# Patient Record
Sex: Male | Born: 1975 | Race: Black or African American | Hispanic: No
Health system: Southern US, Community
[De-identification: ages and names within clinical notes are randomized; demographics above are authoritative.]

---

## 2006-11-18 ENCOUNTER — Emergency Department (HOSPITAL_COMMUNITY): Admission: EM | Admit: 2006-11-18 | Discharge: 2006-11-18 | Payer: Self-pay | Admitting: Emergency Medicine

## 2006-11-25 ENCOUNTER — Emergency Department (HOSPITAL_COMMUNITY): Admission: EM | Admit: 2006-11-25 | Discharge: 2006-11-25 | Payer: Self-pay | Admitting: Emergency Medicine

## 2006-12-15 ENCOUNTER — Emergency Department (HOSPITAL_COMMUNITY): Admission: EM | Admit: 2006-12-15 | Discharge: 2006-12-15 | Payer: Self-pay | Admitting: Emergency Medicine
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
## 2015-10-06 ENCOUNTER — Emergency Department (HOSPITAL_COMMUNITY)
Admission: EM | Admit: 2015-10-06 | Discharge: 2015-10-07 | Disposition: A | Discharge status: Home / Self Care | Payer: Self-pay | Attending: Emergency Medicine | Admitting: Emergency Medicine

## 2015-10-06 ENCOUNTER — Encounter (HOSPITAL_COMMUNITY): Payer: Self-pay | Admitting: Emergency Medicine

## 2015-10-06 DIAGNOSIS — Y9389 Activity, other specified: Secondary | ICD-10-CM | POA: Insufficient documentation

## 2015-10-06 DIAGNOSIS — X58XXXA Exposure to other specified factors, initial encounter: Secondary | ICD-10-CM | POA: Insufficient documentation

## 2015-10-06 DIAGNOSIS — Y9289 Other specified places as the place of occurrence of the external cause: Secondary | ICD-10-CM | POA: Insufficient documentation

## 2015-10-06 DIAGNOSIS — Y998 Other external cause status: Secondary | ICD-10-CM | POA: Insufficient documentation

## 2015-10-06 DIAGNOSIS — Z79899 Other long term (current) drug therapy: Secondary | ICD-10-CM | POA: Insufficient documentation

## 2015-10-06 DIAGNOSIS — T7840XA Allergy, unspecified, initial encounter: Secondary | ICD-10-CM | POA: Insufficient documentation
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
## 2015-10-06 NOTE — ED Provider Notes (Signed)
CSN: [id number]     Arrival date & time 10/06/15  2236 History   First MD Initiated Contact with Patient 10/06/15 2246     Chief Complaint  Patient presents with  . Allergic Reaction    lock jaw      (Consider location/radiation/quality/duration/timing/severity/associated sxs/prior Treatment) HPI Luis Hurley is a 40 y.o. male who presents via EMS from jail. Patient reports he takes Seroquel, but was given Zoloft in jail and allergic reaction. Patient reports he had this medication, Zoloft, at approximately 11:00 this morning at around approximately 5 or 6:00 PM he began to develop a scratchy throat. He reports he began to feel his muscles "locking up, my face turned to the side, and my tongue stuck out". He reports the nurse told him that she was administering epinephrine into his shoulder with a syringe. Per nursing note, jail nurse states she visualized lip edema. He reports he is back to baseline now. Denies any difficulties breathing, swallowing, nausea or vomiting, abdominal pain or cramping, rash. No other known allergies.   History reviewed. No pertinent past medical history. History reviewed. No pertinent past surgical history. No family history on file. Social History  Substance Use Topics  . Smoking status: Unknown If Ever Smoked  . Smokeless tobacco: None  . Alcohol Use: None    Review of Systems A 10 point review of systems was completed and was negative except for pertinent positives and negatives as mentioned in the history of present illness     Allergies  Haldol  Home Medications   Prior to Admission medications   Medication Sig Start Date End Date Taking? Authorizing Provider  LORazepam (ATIVAN) 0.5 MG tablet Take 0.5 mg by mouth every 8 (eight) hours as needed for anxiety.   Yes Historical Provider, MD  QUEtiapine (SEROQUEL) 200 MG tablet Take 200 mg by mouth 2 (two) times daily.   Yes Historical Provider, MD   BP 133/94 mmHg  Pulse 58  Temp(Src) 97.7  F (36.5 C) (Oral)  Resp 16  Ht 5\' 7"  (1.702 m)  Wt 70.308 kg  BMI 24.27 kg/m2  SpO2 100% Physical Exam  Constitutional: He is oriented to person, place, and time. He appears well-developed and well-nourished.  HENT:  Head: Normocephalic and atraumatic.  Mouth/Throat: Oropharynx is clear and moist.  Eyes: Conjunctivae are normal. Pupils are equal, round, and reactive to light. Right eye exhibits no discharge. Left eye exhibits no discharge. No scleral icterus.  Neck: Neck supple.  Cardiovascular: Normal rate, regular rhythm and normal heart sounds.   Pulmonary/Chest: Effort normal and breath sounds normal. No respiratory distress. He has no wheezes. He has no rales.  Abdominal: Soft. There is no tenderness.  Musculoskeletal: He exhibits no tenderness.  Neurological: He is alert and oriented to person, place, and time.  Cranial Nerves II-XII grossly intact  Skin: Skin is warm and dry. No rash noted.  Psychiatric: He has a normal mood and affect.  Nursing note and vitals reviewed.   ED Course  Procedures (including critical care time) Labs Review Labs Reviewed - No data to display  Imaging Review No results found. I have personally reviewed and evaluated these images and lab results as part of my medical decision-making.   EKG Interpretation None     Filed Vitals:   10/06/15 2236 10/07/15 0059  BP: 133/84 133/94  Pulse: 64 58  Temp: 98.8 F (37.1 C) 97.7 F (36.5 C)  TempSrc: Oral Oral  Resp: 12 16  Height: 5\' 7"  (  1.702 m)   Weight: 70.308 kg   SpO2: 97% 100%    MDM  Luis Hurley is a 40 y.o. male who presents from jail for evaluation of allergic reaction. Patient has no signs or symptoms concerning for anaphylaxis and story is not concerning for emergent allergic reaction. Physical exam is completely unremarkable. He is at baseline now, hemodynamically stable with normal vital signs and is afebrile. Stable for discharge. Prior to patient discharge, I  discussed and reviewed this case with Dr.Liu   Final diagnoses:  Allergic reaction, initial encounter        Joycie Peek, PA-C 10/07/15 0110  Lavera Guise, MD 10/07/15 [phone]

## 2015-10-06 NOTE — ED Notes (Signed)
Pt comes to Ed, via ems/ jail - pt was given Zoloft and had reaction. cbg 104.  Pt verbalized he was feeling, and jaw was locking up. The jail nurse stated she visualized lip edema  After taking Zoloft 50 mg , Zyprexa 20 mg tabs. New meds given to pt per not having his usual meds. Jail gave the epi pen, and ems provided benadryl. Ems states he verbalizes he is all better now.      Vs on arrival bp 129/85, pulse 67, spo2 97 room air.

## 2015-10-07 ENCOUNTER — Encounter (HOSPITAL_COMMUNITY): Payer: Self-pay | Admitting: Emergency Medicine

## 2015-10-07 ENCOUNTER — Emergency Department (HOSPITAL_COMMUNITY)
Admission: EM | Admit: 2015-10-07 | Discharge: 2015-10-07 | Disposition: A | Discharge status: Home / Self Care | Payer: Self-pay | Attending: Emergency Medicine | Admitting: Emergency Medicine

## 2015-10-07 DIAGNOSIS — T43595A Adverse effect of other antipsychotics and neuroleptics, initial encounter: Secondary | ICD-10-CM | POA: Insufficient documentation

## 2015-10-07 DIAGNOSIS — Z79899 Other long term (current) drug therapy: Secondary | ICD-10-CM | POA: Insufficient documentation

## 2015-10-07 DIAGNOSIS — G2402 Drug induced acute dystonia: Secondary | ICD-10-CM | POA: Insufficient documentation

## 2015-10-07 DIAGNOSIS — T43225A Adverse effect of selective serotonin reuptake inhibitors, initial encounter: Secondary | ICD-10-CM | POA: Insufficient documentation

## 2015-10-07 MED ORDER — DIPHENHYDRAMINE HCL 25 MG PO CAPS
50.0000 mg | ORAL_CAPSULE | Freq: Once | ORAL | Status: AC
  Administered 2015-10-07: 50 mg via ORAL
  Filled 2015-10-07: qty 2

## 2015-10-07 MED ORDER — DIPHENHYDRAMINE HCL 25 MG PO TABS: 50.0000 mg | ORAL_TABLET | Freq: Four times a day (QID) | ORAL | Status: AC | PRN

## 2015-10-07 NOTE — ED Notes (Signed)
re arrived pt after dc for another occurrence of lock jaw.

## 2015-10-07 NOTE — Discharge Instructions (Signed)
You did not have an allergic reaction. You had a dystonic reaction to the Zyprexa. Please do not get administered epinephrine if this occurs again. Treatment for this is Benadryl or benztropine.Take benadryl 50 mg every 6 hours as needed. Your physician at your facility shoulder consider using a different medication other than Zyprexa.      Dystonic Reaction Dystonia is a condition that makes your muscles contract without warning (muscle spasms). It can cause unwanted, uncomfortable jerking of muscle groups. This condition is rarely life threatening. CAUSES A dystonic reaction is most often a side effect of a particular medicine.These reactions occur when the normal patterns of the nerve receptors are upset by a particular medicine, and the imbalance causes multiple types of muscle spasm. This condition may also be caused by nervous system disorders, such as:  Stroke.  Multiple sclerosis (MS).  Cerebral palsy. In some cases, the cause is not known (idiopathic). RISK FACTORS This condition is more likely to develop in people who take certain medicines, most often medicines that are used to treat psychiatric conditions or nausea. SYMPTOMS Symptoms of dystonia can vary. Symptoms may include:  Muscle twitches or spasms around your eyes (blepharospasm).  Foot cramping or dragging.  Pulling of your neck to one side (torticollis) or backward (retrocollis).  Muscles spasms of your face.  Spasms of your voice box (larynx).  Tremors.  Awkward and painful positions.  Muscle cramping after muscle use.  Spasm of your jaw muscles that makes it difficult to open your mouth. DIAGNOSIS This condition is often easy to diagnose based on the patterns of muscle contractions in your body and how your body responds to treatment. Diagnosis will also include a physical exam and medical history. You may have other tests if the cause of your condition is not known. TREATMENT The treatment of this  condition depends on the underlying cause. If this condition is caused by medicine that you take, your health care provider will likely recommend stopping the use of that medicine. Most often, this condition can be treated with medicines that help to relax the muscles and reverse the reaction (anticholinergics). Botulinum toxin may also be injected to stop the involved muscles from contracting. In severe cases when these treatments do not work, surgery and brain stimulation may be required. HOME CARE INSTRUCTIONS  Talk with your health care provider about avoiding the use of the medicine or medicines that are thought to be the cause of the reaction.  Do not drive or operate heavy machinery until your health care provider approves.  Take medicines only as directed by your health care provider. SEEK MEDICAL CARE IF:  Your original symptoms return after treatment.   This information is not intended to replace advice given to you by your health care provider. Make sure you discuss any questions you have with your health care provider.   Document Released: 07/01/2000 Document Revised: 11/18/2014 Document Reviewed: 06/30/2014 Elsevier Interactive Patient Education [employer]2016 [employer]

## 2015-10-07 NOTE — Discharge Instructions (Signed)
There does not appear to be in emergent cause for your symptoms at this time. I do not think you experienced any anaphylaxis or emergent allergic reaction. Follow-up with your doctor as needed. Return to ED for any new or worsening symptoms  Allergies An allergy is an abnormal reaction to a substance by the body's defense system (immune system). Allergies can develop at any age. WHAT CAUSES ALLERGIES? An allergic reaction happens when the immune system mistakenly reacts to a normally harmless substance, called an allergen, as if it were harmful. The immune system releases antibodies to fight the substance. Antibodies eventually release a chemical called histamine into the bloodstream. The release of histamine is meant to protect the body from infection, but it also causes discomfort. An allergic reaction can be triggered by:  Eating an allergen.  Inhaling an allergen.  Touching an allergen. WHAT TYPES OF ALLERGIES ARE THERE? There are many types of allergies. Common types include:  Seasonal allergies. People with this type of allergy are usually allergic to substances that are only present during certain seasons, such as molds and pollens.  Food allergies.  Drug allergies.  Insect allergies.  Animal dander allergies. WHAT ARE SYMPTOMS OF ALLERGIES? Possible allergy symptoms include:  Swelling of the lips, face, tongue, mouth, or throat.  Sneezing, coughing, or wheezing.  Nasal congestion.  Tingling in the mouth.  Rash.  Itching.  Itchy, red, swollen areas of skin (hives).  Watery eyes.  Vomiting.  Diarrhea.  Dizziness.  Lightheadedness.  Fainting.  Trouble breathing or swallowing.  Chest tightness.  Rapid heartbeat. HOW ARE ALLERGIES DIAGNOSED? Allergies are diagnosed with a medical and family history and one or more of the following:  Skin tests.  Blood tests.  A food diary. A food diary is a record of all the foods and drinks you have in a day and  of all the symptoms you experience.  The results of an elimination diet. An elimination diet involves eliminating foods from your diet and then adding them back in one by one to find out if a certain food causes an allergic reaction. HOW ARE ALLERGIES TREATED? There is no cure for allergies, but allergic reactions can be treated with medicine. Severe reactions usually need to be treated at a hospital. HOW CAN REACTIONS BE PREVENTED? The best way to prevent an allergic reaction is by avoiding the substance you are allergic to. Allergy shots and medicines can also help prevent reactions in some cases. People with severe allergic reactions may be able to prevent a life-threatening reaction called anaphylaxis with a medicine given right after exposure to the allergen.   This information is not intended to replace advice given to you by your health care provider. Make sure you discuss any questions you have with your health care provider.   Document Released: 09/27/2002 Document Revised: 07/25/2014 Document Reviewed: 04/15/2014 Elsevier Interactive Patient Education [employer]

## 2015-10-07 NOTE — ED Provider Notes (Signed)
CSN: [id number]     Arrival date & time 10/07/15  [id number] History   First MD Initiated Contact with Patient 10/07/15 0203     Chief Complaint  Patient presents with  . Allergic Reaction    lock jaw      (Consider location/radiation/quality/duration/timing/severity/associated sxs/prior Treatment) HPI 40 year old male who presents with concern of allergic reaction. He is presenting from [facility], and states that he was just started on Zoloft and Zyprexa today. Shortly after taking this medication I he felt like his body stiffened, he had spasming of his tongue to the left side, and felt spasms in his neck forcing his neck to turn left. He did not have any oral pharyngeal swelling, difficulty breathing, chest pain, nausea or vomiting, or abdominal pain. Was given an EpiPen by the nurse at his facility as well as Benadryl. On arrival he states that all of his symptoms fully resolved. Was sent back to correctional facility as his symptoms and was not consistent with allergic reaction and likely dystonic reaction. He was brought back to the ED, due to recurrence of his dystonic reaction in route back to the facility. Denies any difficulty breathing, chest pain, swelling of his throat or mouth. States that he otherwise has been in his usual state of health.  History reviewed. No pertinent past medical history. History reviewed. No pertinent past surgical history. History reviewed. No pertinent family history. Social History  Substance Use Topics  . Smoking status: Unknown If Ever Smoked  . Smokeless tobacco: None  . Alcohol Use: None    Review of Systems 10/14 systems reviewed and are negative other than those stated in the HPI    Allergies  Haldol and Sertraline  Home Medications   Prior to Admission medications   Medication Sig Start Date End Date Taking? Authorizing Provider  LORazepam (ATIVAN) 0.5 MG tablet Take 0.5 mg by mouth every 8 (eight) hours as needed for anxiety.    Yes Historical Provider, MD  QUEtiapine (SEROQUEL) 200 MG tablet Take 200 mg by mouth 2 (two) times daily.   Yes Historical Provider, MD  diphenhydrAMINE (BENADRYL) 25 MG tablet Take 2 tablets (50 mg total) by mouth every 6 (six) hours as needed (dystonic reaction). 10/07/15   Lavera Guiseana Duo Bassy Fetterly, MD   BP 130/87 mmHg  Pulse 68  Temp(Src) 98.8 F (37.1 C) (Oral)  Resp 18  SpO2 100% Physical Exam Nursing note and vitals reviewed.  Constitutional: Well developed, well nourished, non-toxic, and in no acute distress  Head: Normocephalic and atraumatic.  Mouth/Throat: Oropharynx is clear and moist. No oropharyngeal swelling. Deviation of tongue to the left with turning of the head to the left.  Neck: Normal range of motion. Neck supple.  Cardiovascular: Normal rate and regular rhythm.  Pulmonary/Chest: Effort normal and breath sounds normal.  Abdominal: Soft. There is no tenderness. There is no rebound and no guarding.  Musculoskeletal: Normal range of motion.  Neurological: Alert, no facial droop, fluent speech, moves all extremities symmetrically  Skin: Skin is warm and dry.  Psychiatric: Cooperative   ED Course  Procedures (including critical care time) Labs Review Labs Reviewed - No data to display  Imaging Review No results found. I have personally reviewed and evaluated these images and lab results as part of my medical decision-making.   EKG Interpretation None      MDM   Final diagnoses:  Dystonic drug reaction    Presenting with dystonic reaction in the setting of starting his Zyprexa likely. I  he states that he has had similar presentation with Haldol in the past. Previous symptoms resolved likely due to his Benadryl. Is given an additional 50 mg of Benadryl here. We'll observe to make sure symptoms resolve. I do not suspect anaphylaxis or allergic reaction. Will be discharged back to the facility.     Lavera Guise, MD 10/10/15 2135

## 2015-10-07 NOTE — ED Notes (Signed)
Pt was given 50mg  benadry tab  verbalized order from provider. Medicine not scanned in new chart.

## 2015-10-07 NOTE — ED Notes (Signed)
Tried to make record as accurate as possible.
